# Patient Record
Sex: Male | Born: 1965 | Race: White | Hispanic: No | Marital: Married | Smoking: Never smoker
Health system: Southern US, Community
[De-identification: ages and names within clinical notes are randomized; demographics above are authoritative.]

## PROBLEM LIST (undated history)

## (undated) DIAGNOSIS — I10 Essential (primary) hypertension: Secondary | ICD-10-CM

## (undated) HISTORY — PX: GALLBLADDER SURGERY: SHX652

## (undated) HISTORY — PX: TUMOR REMOVAL: SHX12

---

## 1998-04-13 ENCOUNTER — Ambulatory Visit (HOSPITAL_COMMUNITY): Admission: RE | Admit: 1998-04-13 | Discharge: 1998-04-14 | Payer: Self-pay | Admitting: *Deleted

## 1998-04-13 ENCOUNTER — Encounter: Payer: Self-pay | Admitting: *Deleted

## 2011-11-14 ENCOUNTER — Other Ambulatory Visit: Payer: Self-pay | Admitting: Gastroenterology

## 2011-11-19 ENCOUNTER — Ambulatory Visit
Admission: RE | Admit: 2011-11-19 | Discharge: 2011-11-19 | Disposition: A | Discharge status: Home / Self Care | Payer: 59 | Source: Ambulatory Visit | Attending: Gastroenterology | Admitting: Gastroenterology

## 2011-11-20 ENCOUNTER — Other Ambulatory Visit: Payer: Self-pay | Admitting: Gastroenterology

## 2011-11-20 DIAGNOSIS — K769 Liver disease, unspecified: Secondary | ICD-10-CM

## 2011-11-25 ENCOUNTER — Other Ambulatory Visit: Payer: Self-pay | Admitting: Gastroenterology

## 2011-11-25 ENCOUNTER — Ambulatory Visit
Admission: RE | Admit: 2011-11-25 | Discharge: 2011-11-25 | Disposition: A | Discharge status: Home / Self Care | Payer: 59 | Source: Ambulatory Visit | Attending: Gastroenterology | Admitting: Gastroenterology

## 2011-11-25 DIAGNOSIS — K769 Liver disease, unspecified: Secondary | ICD-10-CM

## 2011-12-02 ENCOUNTER — Other Ambulatory Visit (HOSPITAL_COMMUNITY): Payer: Self-pay | Admitting: Gastroenterology

## 2011-12-02 DIAGNOSIS — K7689 Other specified diseases of liver: Secondary | ICD-10-CM

## 2011-12-11 ENCOUNTER — Encounter (HOSPITAL_COMMUNITY)
Admission: RE | Admit: 2011-12-11 | Discharge: 2011-12-11 | Disposition: A | Discharge status: Home / Self Care | Payer: 59 | Source: Ambulatory Visit | Attending: Gastroenterology | Admitting: Gastroenterology

## 2011-12-11 DIAGNOSIS — K7689 Other specified diseases of liver: Secondary | ICD-10-CM | POA: Insufficient documentation

## 2011-12-11 LAB — GLUCOSE, CAPILLARY: Glucose-Capillary: 127 mg/dL — ABNORMAL HIGH (ref 70–99)

## 2011-12-11 MED ORDER — FLUDEOXYGLUCOSE F - 18 (FDG) INJECTION
17.9000 | Freq: Once | INTRAVENOUS | Status: AC | PRN
  Administered 2011-12-11: 17.9 via INTRAVENOUS

## 2013-07-12 IMAGING — PT NM PET TUM IMG INITIAL (PI) SKULL BASE T - THIGH
1 of 6 series · 1 of 25 positions shown · non-contrast
Comparison: MRI 11/25/2011

CLINICAL DATA: Initial treatment strategy for indeterminate lesion
in the liver on MRI..

NUCLEAR MEDICINE PET SKULL BASE TO THIGH
Fasting Blood Glucose:  127
TECHNIQUE: 17.9 mCi F-18 FDG was injected intravenously. CT data
was obtained and used for attenuation correction and anatomic
localization only.  (This was not acquired as a diagnostic CT
examination.) Additional exam technical data entered on
technologist worksheet.

[Series 2: ct images · axial · 3.8mm · 0.98mm/px · 1 of 249 slices shown]
[im 249/249  brain]
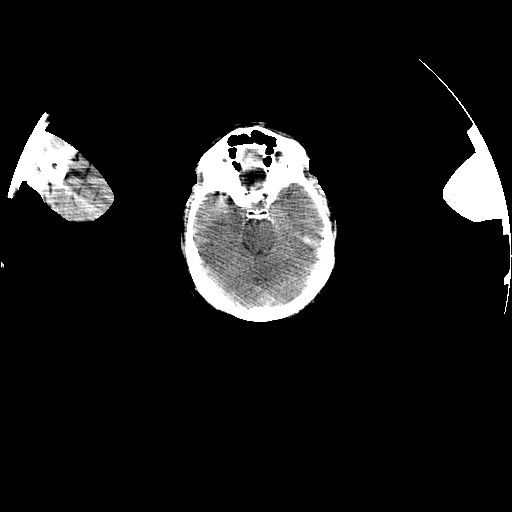

[1 of 25 positions shown; findings below may reference images not displayed]

FINDINGS: Neck: No hypermetabolic lymph nodes in the neck.

Chest:  No hypermetabolic mediastinal or hilar nodes.  No
suspicious pulmonary nodules on the CT scan.

Abdomen/Pelvis:  The two lesions within the liver  noted on the
comparison MRI are evident on the noncontrast CT portion of the
exam.   The lesions are relatively hyperdense on the background of
fatty infiltration measuring 20 mm in the medial right hepatic lobe
(image 135) and 14 mm along the falciform ligament.  These lesions
demonstrate no associate hypermetabolic activity.

[place]:  No focal hypermetabolic activity to suggest skeletal
metastasis.
IMPRESSION: No hypermetabolic activity associated with  hepatic lesions
described on comparison MRI.  Favor these to represent benign
lesions with differential diagnosis including atypical hemangioma,
focal nodular hyperplasia, or atypical focal fatty sparing. Due to
the indeterminate nature of the lesion, recommend follow-up
ultrasound in  6 months.

## 2013-07-19 ENCOUNTER — Other Ambulatory Visit: Payer: Self-pay | Admitting: Physician Assistant

## 2013-07-19 ENCOUNTER — Ambulatory Visit
Admission: RE | Admit: 2013-07-19 | Discharge: 2013-07-19 | Disposition: A | Discharge status: Home / Self Care | Payer: 59 | Source: Ambulatory Visit | Attending: Physician Assistant | Admitting: Physician Assistant

## 2013-07-19 DIAGNOSIS — R059 Cough, unspecified: Secondary | ICD-10-CM

## 2013-07-19 DIAGNOSIS — R05 Cough: Secondary | ICD-10-CM

## 2013-09-29 ENCOUNTER — Ambulatory Visit (INDEPENDENT_AMBULATORY_CARE_PROVIDER_SITE_OTHER): Payer: 59 | Admitting: Licensed Clinical Social Worker

## 2013-09-29 DIAGNOSIS — F411 Generalized anxiety disorder: Secondary | ICD-10-CM

## 2013-10-06 ENCOUNTER — Ambulatory Visit (INDEPENDENT_AMBULATORY_CARE_PROVIDER_SITE_OTHER): Payer: 59 | Admitting: Licensed Clinical Social Worker

## 2013-10-06 DIAGNOSIS — F411 Generalized anxiety disorder: Secondary | ICD-10-CM

## 2013-10-19 ENCOUNTER — Ambulatory Visit: Payer: 59 | Admitting: Licensed Clinical Social Worker

## 2013-10-26 ENCOUNTER — Ambulatory Visit (INDEPENDENT_AMBULATORY_CARE_PROVIDER_SITE_OTHER): Payer: 59 | Admitting: Licensed Clinical Social Worker

## 2013-10-26 DIAGNOSIS — F411 Generalized anxiety disorder: Secondary | ICD-10-CM

## 2013-11-16 ENCOUNTER — Ambulatory Visit (INDEPENDENT_AMBULATORY_CARE_PROVIDER_SITE_OTHER): Payer: 59 | Admitting: Licensed Clinical Social Worker

## 2013-11-16 DIAGNOSIS — F411 Generalized anxiety disorder: Secondary | ICD-10-CM

## 2013-11-30 ENCOUNTER — Ambulatory Visit: Payer: 59 | Admitting: Licensed Clinical Social Worker

## 2014-07-02 ENCOUNTER — Emergency Department (HOSPITAL_COMMUNITY)
Admission: EM | Admit: 2014-07-02 | Discharge: 2014-07-02 | Disposition: A | Discharge status: Home / Self Care | Payer: 59 | Attending: Emergency Medicine | Admitting: Emergency Medicine

## 2014-07-02 ENCOUNTER — Emergency Department (HOSPITAL_COMMUNITY): Payer: 59

## 2014-07-02 ENCOUNTER — Encounter (HOSPITAL_COMMUNITY): Payer: Self-pay | Admitting: *Deleted

## 2014-07-02 DIAGNOSIS — I10 Essential (primary) hypertension: Secondary | ICD-10-CM | POA: Insufficient documentation

## 2014-07-02 DIAGNOSIS — E669 Obesity, unspecified: Secondary | ICD-10-CM | POA: Insufficient documentation

## 2014-07-02 DIAGNOSIS — I160 Hypertensive urgency: Secondary | ICD-10-CM

## 2014-07-02 DIAGNOSIS — R140 Abdominal distension (gaseous): Secondary | ICD-10-CM | POA: Diagnosis present

## 2014-07-02 DIAGNOSIS — K7689 Other specified diseases of liver: Secondary | ICD-10-CM | POA: Insufficient documentation

## 2014-07-02 DIAGNOSIS — K769 Liver disease, unspecified: Secondary | ICD-10-CM

## 2014-07-02 HISTORY — DX: Essential (primary) hypertension: I10

## 2014-07-02 LAB — COMPREHENSIVE METABOLIC PANEL
ALK PHOS: 147 U/L — AB (ref 39–117)
ALT: 337 U/L — AB (ref 0–53)
AST: 350 U/L — ABNORMAL HIGH (ref 0–37)
Albumin: 4.1 g/dL (ref 3.5–5.2)
Anion gap: 18 — ABNORMAL HIGH (ref 5–15)
BUN: 7 mg/dL (ref 6–23)
CALCIUM: 10.4 mg/dL (ref 8.4–10.5)
CO2: 23 meq/L (ref 19–32)
Chloride: 94 mEq/L — ABNORMAL LOW (ref 96–112)
Creatinine, Ser: 0.64 mg/dL (ref 0.50–1.35)
GFR calc non Af Amer: >90 mL/min (ref >90)
GLUCOSE: 144 mg/dL — AB (ref 70–99)
Potassium: 3.8 mEq/L (ref 3.7–5.3)
SODIUM: 135 meq/L — AB (ref 137–147)
Total Bilirubin: 2.7 mg/dL — ABNORMAL HIGH (ref 0.3–1.2)
Total Protein: 8.2 g/dL (ref 6.0–8.3)

## 2014-07-02 LAB — CBC WITH DIFFERENTIAL/PLATELET
Basophils Absolute: 0.1 10*3/uL (ref 0.0–0.1)
Basophils Relative: 1 % (ref 0–1)
EOS PCT: 3 % (ref 0–5)
Eosinophils Absolute: 0.3 10*3/uL (ref 0.0–0.7)
HCT: 43.7 % (ref 39.0–52.0)
HEMOGLOBIN: 15.1 g/dL (ref 13.0–17.0)
LYMPHS ABS: 0.9 10*3/uL (ref 0.7–4.0)
LYMPHS PCT: 9 % — AB (ref 12–46)
MCH: 32.4 pg (ref 26.0–34.0)
MCHC: 34.6 g/dL (ref 30.0–36.0)
MCV: 93.8 fL (ref 78.0–100.0)
MONOS PCT: 11 % (ref 3–12)
Monocytes Absolute: 1.1 10*3/uL — ABNORMAL HIGH (ref 0.1–1.0)
Neutro Abs: 7.7 10*3/uL (ref 1.7–7.7)
Neutrophils Relative %: 76 % (ref 43–77)
PLATELETS: 154 10*3/uL (ref 150–400)
RBC: 4.66 MIL/uL (ref 4.22–5.81)
RDW: 14.3 % (ref 11.5–15.5)
WBC: 10.1 10*3/uL (ref 4.0–10.5)

## 2014-07-02 LAB — URINALYSIS, ROUTINE W REFLEX MICROSCOPIC
GLUCOSE, UA: NEGATIVE mg/dL
HGB URINE DIPSTICK: NEGATIVE
Ketones, ur: 40 mg/dL — AB
Leukocytes, UA: NEGATIVE
Nitrite: NEGATIVE
PROTEIN: 30 mg/dL — AB
Specific Gravity, Urine: 1.015 (ref 1.005–1.030)
Urobilinogen, UA: 1 mg/dL (ref 0.0–1.0)
pH: 6.5 (ref 5.0–8.0)

## 2014-07-02 LAB — URINE MICROSCOPIC-ADD ON

## 2014-07-02 LAB — LIPASE, BLOOD: Lipase: 47 U/L (ref 11–59)

## 2014-07-02 MED ORDER — SODIUM CHLORIDE 0.9 % IV BOLUS (SEPSIS)
1000.0000 mL | Freq: Once | INTRAVENOUS | Status: AC
  Administered 2014-07-02: 1000 mL via INTRAVENOUS

## 2014-07-02 MED ORDER — HYDRALAZINE HCL 20 MG/ML IJ SOLN
10.0000 mg | INTRAMUSCULAR | Status: AC
  Administered 2014-07-02: 10 mg via INTRAVENOUS
  Filled 2014-07-02: qty 1

## 2014-07-02 MED ORDER — LORAZEPAM 2 MG/ML IJ SOLN
1.0000 mg | Freq: Once | INTRAMUSCULAR | Status: AC
  Administered 2014-07-02: 1 mg via INTRAVENOUS
  Filled 2014-07-02: qty 1

## 2014-07-02 MED ORDER — METOPROLOL SUCCINATE ER 25 MG PO TB24: 25.0000 mg | ORAL_TABLET | Freq: Every day | ORAL | Status: DC

## 2014-07-02 MED ORDER — HYDRALAZINE HCL 10 MG PO TABS: 10.0000 mg | ORAL_TABLET | Freq: Three times a day (TID) | ORAL | Status: AC

## 2014-07-02 MED ORDER — IOHEXOL 300 MG/ML  SOLN
50.0000 mL | Freq: Once | INTRAMUSCULAR | Status: AC | PRN
  Administered 2014-07-02: 50 mL via ORAL

## 2014-07-02 NOTE — ED Notes (Signed)
Pt ambulated to restroom with steady gait.

## 2014-07-02 NOTE — ED Notes (Signed)
Pt noted a small "knot" at top of incisiton  Line yesterday, noted to feel bloated and with pain of 1-2. More pain with sitting. No N/V Incision is several years old.

## 2014-07-02 NOTE — ED Provider Notes (Signed)
CSN: [id number]     Arrival date & time 07/02/14  0109 History   First MD Initiated Contact with Patient 07/02/14 1118     Chief Complaint  Patient presents with  . Bloated  . Hypertension     (Consider location/radiation/quality/duration/timing/severity/associated sxs/prior Treatment) HPI  Patient presents with concern of anorexia, nausea, abdominal pain and mass. Patient is approximately 3 days ago, he noticed a palpable lesion in his upper abdomen.  Prior to this he had a gradually become aware of increasing generalized bloating in the abdomen as well. During this illness the patient has had a gradually developing anorexia, with decreased by mouth intake. Patient typically drinks substantial amounts, has decreased alcohol intake notably over the past week. Currently he denies lightheadedness, syncope, confusion, his rotation, chest pain, dyspnea. Patient has history of abdominal surgery in the distant past. Patient has a history of anxiety, hypertension.   Past Medical History  Diagnosis Date  . Hypertension    Past Surgical History  Procedure Laterality Date  . Tumor removal     No family history on file. History  Substance Use Topics  . Smoking status: Never Smoker   . Smokeless tobacco: Not on file  . Alcohol Use: Yes    Review of Systems  Constitutional:       Per HPI, otherwise negative  HENT:       Per HPI, otherwise negative  Respiratory:       Per HPI, otherwise negative  Cardiovascular:       Per HPI, otherwise negative  Gastrointestinal: Negative for vomiting.  Endocrine:       Negative aside from HPI  Genitourinary:       Neg aside from HPI   Musculoskeletal:       Per HPI, otherwise negative  Skin: Negative.   Neurological: Negative for syncope.      Allergies  Review of patient's allergies indicates no known allergies.  Home Medications   Prior to Admission medications   Not on File   BP 184/98 mmHg  Pulse 115  Temp(Src) 99 F  (37.2 C) (Oral)  Wt 243 lb (110.224 kg)  SpO2 100% Physical Exam  Constitutional: He is oriented to person, place, and time. He appears well-developed. No distress.  Obese male resting on the gurney, twitching  HENT:  Head: Normocephalic and atraumatic.  Eyes: Conjunctivae and EOM are normal.  Cardiovascular: Normal rate and regular rhythm.   Pulmonary/Chest: Effort normal. No stridor. No respiratory distress.  Abdominal: He exhibits no distension.    Musculoskeletal: He exhibits no edema.  Neurological: He is alert and oriented to person, place, and time.  Twitching otherwise unremarkable  Skin: Skin is warm and dry.  Psychiatric: He has a normal mood and affect.  Nursing note and vitals reviewed.   ED Course  Procedures (including critical care time) Labs Review Labs Reviewed  CBC WITH DIFFERENTIAL - Abnormal; Notable for the following:    Lymphocytes Relative 9 (*)    Monocytes Absolute 1.1 (*)    All other components within normal limits  COMPREHENSIVE METABOLIC PANEL - Abnormal; Notable for the following:    Sodium 135 (*)    Chloride 94 (*)    Glucose, Bld 144 (*)    AST 350 (*)    ALT 337 (*)    Alkaline Phosphatase 147 (*)    Total Bilirubin 2.7 (*)    Anion gap 18 (*)    All other components within normal limits  URINALYSIS, ROUTINE W  REFLEX MICROSCOPIC - Abnormal; Notable for the following:    Color, Urine AMBER (*)    Bilirubin Urine SMALL (*)    Ketones, ur 40 (*)    Protein, ur 30 (*)    All other components within normal limits  LIPASE, BLOOD  URINE MICROSCOPIC-ADD ON    Imaging Review Ct Abdomen Pelvis Wo Contrast  07/02/2014   CLINICAL DATA:  Upper abdominal pressure x3 days. Prior cholecystectomy.  EXAM: CT ABDOMEN AND PELVIS WITHOUT CONTRAST  TECHNIQUE: Multidetector CT imaging of the abdomen and pelvis was performed following the standard protocol without IV contrast.  COMPARISON:  PET-CT dated 12/11/2011.  MRI abdomen dated 11/25/2011.   FINDINGS: Lower chest:  Lung bases are clear.  Hepatobiliary: Severe hepatic steatosis.  10 mm lesion along the falciform ligament (series 2/image 20) and 9 mm lesion in the anterior segment right hepatic lobe (series 2/image 30), less conspicuous than on prior MRI, possibly reflecting focal fatty sparing.  Status post cholecystectomy. No intrahepatic or extrahepatic ductal dilatation.  Pancreas: Within normal limits.  Spleen: Within normal limits.  Adrenals/Urinary Tract: Adrenal gland unremarkable.  Kidneys are within normal limits. No renal calculi or hydronephrosis.  No ureteral or bladder calculi.  Bladder is mildly thick-walled but underdistended.  Stomach/Bowel: Stomach is unremarkable.  No evidence of bowel obstruction.  Normal appendix.  Vascular/Lymphatic: No evidence of abdominal aortic aneurysm.  No suspicious abdominopelvic lymphadenopathy.  Reproductive: Prostate is unremarkable.  Other: No abdominopelvic ascites.  Small fat containing left inguinal hernia.  Musculoskeletal: Mild degenerative changes of the visualized thoracolumbar spine.  IMPRESSION: Severe hepatic steatosis.  Prior cholecystectomy.  No CT findings to account for the patient's abdominal pain.   Electronically Signed   By: Julian Hy M.D.   On: 07/02/2014 13:46   3:00 PM Patient awake and alert, sitting upright, smiling. BP has improved, Patient is in no distress, and he and his wife both voice understanding all findings, including liver function abnormalities. Patient voices a preference for outpatient follow-up. Patient has access to a gastroenterologist, and this seems reasonable.  MDM  Patient presents with abdominal pain, palpable mass, nausea, anorexia. Patient has a history of substantial alcohol intake, though none recently. 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 Patient's blood pressure decreased with single dose of beta blocker, and given the patient's  previous he diagnosed hypertension, he will be started on this medication. Patient will follow up with his primary care physician and gastroenterology for further evaluation and management per his request.    Carmin Muskrat, MD 07/02/14 [phone]

## 2014-07-02 NOTE — Discharge Instructions (Signed)
As discussed, it is important that you follow up as soon as possible with your physician for continued management of your condition.  If you develop any new, or concerning changes in your condition, please return to the emergency department immediately.  Please continue to refrain from taking alcohol.

## 2014-07-27 ENCOUNTER — Telehealth: Payer: Self-pay | Admitting: *Deleted

## 2014-07-27 ENCOUNTER — Encounter: Payer: Self-pay | Admitting: Internal Medicine

## 2014-07-27 ENCOUNTER — Ambulatory Visit (INDEPENDENT_AMBULATORY_CARE_PROVIDER_SITE_OTHER): Payer: 59 | Admitting: Internal Medicine

## 2014-07-27 ENCOUNTER — Ambulatory Visit (INDEPENDENT_AMBULATORY_CARE_PROVIDER_SITE_OTHER)
Admission: RE | Admit: 2014-07-27 | Discharge: 2014-07-27 | Disposition: A | Discharge status: Home / Self Care | Payer: 59 | Source: Ambulatory Visit | Attending: Internal Medicine | Admitting: Internal Medicine

## 2014-07-27 VITALS — BP 146/80 | HR 72 | Ht 68.0 in | Wt 226.0 lb

## 2014-07-27 DIAGNOSIS — R05 Cough: Secondary | ICD-10-CM

## 2014-07-27 DIAGNOSIS — R058 Other specified cough: Secondary | ICD-10-CM | POA: Insufficient documentation

## 2014-07-27 MED ORDER — PANTOPRAZOLE SODIUM 40 MG PO TBEC: 40.0000 mg | DELAYED_RELEASE_TABLET | Freq: Every day | ORAL | Status: DC

## 2014-07-27 MED ORDER — TRAMADOL HCL 50 MG PO TABS: ORAL_TABLET | ORAL | Status: DC

## 2014-07-27 MED ORDER — PREDNISONE 10 MG PO TABS: ORAL_TABLET | ORAL | Status: DC

## 2014-07-27 MED ORDER — FAMOTIDINE 20 MG PO TABS: ORAL_TABLET | ORAL | Status: DC

## 2014-07-27 NOTE — Telephone Encounter (Signed)
-----   Message from Tanda Rockers, MD sent at 07/27/2014  4:26 PM EST ----- Call pt:  Reviewed cxr and no acute change so no change in recommendations made at [facility]

## 2014-07-27 NOTE — Telephone Encounter (Signed)
Patient informed no acute changes in cxr therefore no change in office recommendations. Patient verbalizes understanding.

## 2014-07-27 NOTE — Assessment & Plan Note (Signed)

## 2014-07-27 NOTE — Progress Notes (Signed)
Subjective:    Patient ID: Joe Simmons, male    DOB: 12/20/1965,    MRN: [id number]  HPI  79 yowm never smoker   with onset cough Nov 2014 dx as bronchitis but never completely resolved p rx with abx  so self referred to pulmonary clinic 07/27/2014    07/27/2014 1st [facility] office visit/ Raguel Kosloski   Chief Complaint  Patient presents with  . Pulmonary Consult    Self referral. Pt c/o cough x 4 wks- non prod and made worse by cold air. He states that he gets SOB "with any exercise, or when I move around too much".   seen in ER for abd pain ? 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 but says no longer drinking since in ER   Cough  Daily x one year p abrupt onset duuring uri:  Typically starts  immediately p wakes up each am worse with cold air/ coughs so hard vomit otherwise non productive - tends to settle down overnight  Worse with symbicort Mostly sob with coughing but also if over does it  No obvious other patterns in day to day or daytime variabilty or assoc   cp or chest tightness, subjective wheeze overt sinus or hb symptoms. No unusual exp hx or h/o childhood pna/ asthma or knowledge of premature birth.  Sleeping ok without nocturnal  or early am exacerbation  of respiratory  c/o's or need for noct saba. Also denies any obvious fluctuation of symptoms with weather or environmental changes or other aggravating or alleviating factors except as outlined above   Current Medications, Allergies, Complete Past Medical History, Past Surgical History, Family History, and Social History were reviewed in [employer] record.             Review of Systems  Constitutional: Positive for appetite change. Negative for fever, chills, activity change and unexpected weight change.  HENT: Negative for congestion, dental problem, postnasal drip, rhinorrhea, sneezing, sore throat, trouble swallowing and voice change.   Eyes: Negative for visual disturbance.  Respiratory: Positive for cough  and shortness of breath. Negative for choking.   Cardiovascular: Negative for chest pain and leg swelling.  Gastrointestinal: Negative for nausea, vomiting and abdominal pain.  Genitourinary: Negative for difficulty urinating.  Musculoskeletal: Negative for arthralgias.  Skin: Negative for rash.  Psychiatric/Behavioral: Negative for behavioral problems and confusion.       Objective:   Physical Exam  amb wm nad  . Wt Readings from Last 3 Encounters:  07/02/14 243 lb (110.224 kg)    Vital signs reviewed    HEENT: nl dentition, turbinates, and orophanx. Nl external ear canals without cough reflex   NECK :  without JVD/Nodes/TM/ nl carotid upstrokes bilaterally   LUNGS: no acc muscle use, clear to A and P bilaterally with cough variable on insp    CV:  RRR  no s3 or murmur or increase in P2, no edema   ABD:  soft and nontender with nl excursion in the supine position. No bruits or organomegaly, bowel sounds nl  MS:  warm without deformities, calf tenderness, cyanosis or clubbing  SKIN: warm and dry without lesions    NEURO:  alert, approp, no deficits    CXR PA and Lateral:   07/27/2014 :     I personally reviewed images and agree with radiology impression as follows:     1. Low lung volumes with mild basilar atelectasis.  2. Mild cardiomegaly, no CHF.     Assessment &  Plan:

## 2014-07-27 NOTE — Patient Instructions (Signed)
The key to effective treatment for your cough is eliminating the non-stop cycle of cough you're stuck in long enough to let your airway heal completely and then see if there is anything still making you cough once you stop the cough suppression, but this should take no more than 5 days to figure out  First take delsym two tsp every 12 hours and supplement if needed with  tramadol 50 mg up to 2 every 4 hours to suppress the urge to cough at all or even clear your throat. Swallowing water or using ice chips/non mint and menthol containing candies (such as lifesavers or sugarless jolly ranchers) are also effective.  You should rest your voice and avoid activities that you know make you cough.  Once you have eliminated the cough for 3 straight days try reducing the tramadol first,  then the delsym as tolerated.    Prednisone 10 mg take  4 each am x 2 days,   2 each am x 2 days,  1 each am x 2 days and stop (this is to eliminate allergies and inflammation from coughing)  Protonix (pantoprazole) Take 30-60 min before first meal of the day and Pepcid 20 mg one bedtime plus chlorpheniramine 4 mg x 2 at bedtime (both available over the counter)  until cough is completely gone for at least a week without the need for cough suppression  For drainage take chlortrimeton (chlorpheniramine) 4 mg every 4 hours available over the counter (may cause drowsiness)   GERD (REFLUX)  is an extremely common cause of respiratory symptoms, many times with no significant heartburn at all.    It can be treated with medication, but also with lifestyle changes including avoidance of late meals, excessive alcohol, smoking cessation, and avoid fatty foods, chocolate, peppermint, colas, red wine, and acidic juices such as orange juice.  NO MINT OR MENTHOL PRODUCTS SO NO COUGH DROPS  USE HARD CANDY INSTEAD (jolley ranchers or Stover's or Lifesavers (all available in sugarless versions) NO OIL BASED VITAMINS - use powdered  substitutes.

## 2014-07-31 ENCOUNTER — Telehealth: Payer: Self-pay | Admitting: Internal Medicine

## 2014-07-31 NOTE — Telephone Encounter (Signed)
Prednisone can cause an acneiform rash but not typically itchy. Can use tramadol for pain, needs to return to see Tammy NP in  A few days if not better but ok to stop prednisone for now

## 2014-07-31 NOTE — Telephone Encounter (Signed)
Wife is aware of recs from [facility] and will call for OV with TP if not better in few days.

## 2014-07-31 NOTE — Telephone Encounter (Signed)
Spoke with patients wife-states patient has developed headache daily for past 4 days(hurts with movement) and now has pimple like outbreak on his face and legs. Would like to know if any of the meds given by MW could cause this and what to do.  Today is the last dose of Prednisone given.  Prednisone Tramadol Pepcid Protonix Delsym OTC cough syrup.  MW please advise. Thanks.

## 2014-08-02 ENCOUNTER — Other Ambulatory Visit: Payer: Self-pay | Admitting: Internal Medicine

## 2014-08-03 ENCOUNTER — Other Ambulatory Visit: Payer: Self-pay | Admitting: Internal Medicine

## 2014-08-03 ENCOUNTER — Telehealth: Payer: Self-pay | Admitting: Internal Medicine

## 2014-08-03 NOTE — Telephone Encounter (Signed)
According to epic this was already called in today. Called made [facility] aware. Nothing further needed

## 2014-08-10 ENCOUNTER — Ambulatory Visit (INDEPENDENT_AMBULATORY_CARE_PROVIDER_SITE_OTHER): Payer: 59 | Admitting: Internal Medicine

## 2014-08-10 ENCOUNTER — Encounter: Payer: Self-pay | Admitting: Internal Medicine

## 2014-08-10 VITALS — BP 140/84 | HR 108 | Ht 69.0 in | Wt 220.0 lb

## 2014-08-10 DIAGNOSIS — L270 Generalized skin eruption due to drugs and medicaments taken internally: Secondary | ICD-10-CM

## 2014-08-10 DIAGNOSIS — R058 Other specified cough: Secondary | ICD-10-CM

## 2014-08-10 DIAGNOSIS — R05 Cough: Secondary | ICD-10-CM

## 2014-08-10 MED ORDER — METHYLPREDNISOLONE 4 MG PO TABS: ORAL_TABLET | ORAL | Status: AC

## 2014-08-10 NOTE — Progress Notes (Signed)
Subjective:    Patient ID: Joe Simmons, male    DOB: 1966-04-26,    MRN: [id number]    Brief patient profile:  26 yowm never smoker   with onset cough Nov 2014 dx as bronchitis but never completely resolved p rx with abx  so self referred to pulmonary clinic 07/27/2014    History of Present Illness  07/27/2014 1st [facility] office visit/ [name]   Chief Complaint  Patient presents with  . Pulmonary Consult    Self referral. Pt c/o cough x 4 wks- non prod and made worse by cold air. He states that he gets SOB "with any exercise, or when I move around too much".   seen in ER for abd pain ? 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 but says no longer drinking since in ER  Cough  Daily x one year p abrupt onset duuring uri:  Typically starts  immediately p wakes up each am worse with cold air/ coughs so hard vomit otherwise non productive - tends to settle down overnight  Worse with symbicort Mostly sob with coughing but also if over does it rec First take delsym two tsp every 12 hours and supplement if needed with  tramadol 50 mg up to 2 every 4 hours Prednisone 10 mg take  4 each am x 2 days,   2 each am x 2 days,  1 each am x 2 days and stop (this is to eliminate allergies and inflammation from coughing) Protonix (pantoprazole) Take 30-60 min before first meal of the day and Pepcid 20 mg one bedtime plus chlorpheniramine 4 mg x 2 at bedtime (both available over the counter)  until cough is completely gone for at least a week without the need for cough suppression For drainage take chlortrimeton (chlorpheniramine) 4 mg every 4 hours   08/10/2014 f/u ov/[name] re: rash/ f/u cough  Chief Complaint  Patient presents with  . Follow-up    Pt states that his SOB and cough have resolved. He c/o rash since 07/29/14- painful and itchy on his back.   did not get 1st gen h1 but cough resolved within 5 days then rash started w/in 3 days  Itchy truncal pattern.  Not limited by breathing from desired activities    Stopped taking ppi and pepcid since onset of rash   No obvious day to day or daytime variabilty or assoc   cp or chest tightness, subjective wheeze overt sinus or hb symptoms. No unusual exp hx or h/o childhood pna/ asthma or knowledge of premature birth.  Sleeping ok without nocturnal  or early am exacerbation  of respiratory  c/o's or need for noct saba. Also denies any obvious fluctuation of symptoms with weather or environmental changes or other aggravating or alleviating factors except as outlined above   Current Medications, Allergies, Complete Past Medical History, Past Surgical History, Family History, and Social History were reviewed in [employer] record.  ROS  The following are not active complaints unless bolded sore throat, dysphagia, dental problems, itching, sneezing,  nasal congestion or excess/ purulent secretions, ear ache,   fever, chills, sweats, unintended wt loss, pleuritic or exertional cp, hemoptysis,  orthopnea pnd or leg swelling, presyncope, palpitations, heartburn, abdominal pain, anorexia, nausea, vomiting, diarrhea  or change in bowel or urinary habits, change in stools or urine, dysuria,hematuria,  rash, arthralgias, visual complaints, headache, numbness weakness or ataxia or problems with walking or coordination,  change in mood/affect or memory.  Objective:   Physical Exam  amb wm nad  Wt Readings from Last 3 Encounters:  08/10/14 220 lb (99.791 kg)  07/27/14 226 lb (102.513 kg)  07/02/14 243 lb (110.224 kg)    Vital signs reviewed    HEENT: nl dentition, turbinates, and orophanx. Nl external ear canals without cough reflex   NECK :  without JVD/Nodes/TM/ nl carotid upstrokes bilaterally   LUNGS: no acc muscle use, clear to A and P bilaterally with no longer cough on insp    CV:  RRR  no s3 or murmur or increase in P2, no edema   ABD:  soft and nontender with nl excursion in the supine position. No  bruits or organomegaly, bowel sounds nl  MS:  warm without deformities, calf tenderness, cyanosis or clubbing  SKIN: acneform rash over torso only  NEURO:  alert, approp, no deficits    CXR PA and Lateral:   07/27/2014 :     I personally reviewed images and agree with radiology impression as follows:     1. Low lung volumes with mild basilar atelectasis.  2. Mild cardiomegaly, no CHF.     Assessment & Plan:

## 2014-08-10 NOTE — Patient Instructions (Signed)
Medrol 4 mg x 4 each am x 2 days,   2 each am x 2 days,  1 each am x 2 days and stop   For drainage or itching   take chlortrimeton (chlorpheniramine) 4 mg every 4 hours available over the counter (may cause drowsiness)   If not better by first of next week call for dermatology referral.

## 2014-08-11 ENCOUNTER — Encounter: Payer: Self-pay | Admitting: Internal Medicine

## 2014-08-11 DIAGNOSIS — L270 Generalized skin eruption due to drugs and medicaments taken internally: Secondary | ICD-10-CM | POA: Insufficient documentation

## 2014-08-11 NOTE — Assessment & Plan Note (Signed)
Improved with elimination of cyclical cough / ? Will it return off gerd rx > since ppi may well have been the cause of the rash rec just rx with 1st gen h1 then if cough comes back try bid h2 rather than rechallenge with ppi  See instructions for specific recommendations which were reviewed directly with the patient who was given a copy with highlighter outlining the key components.

## 2014-08-11 NOTE — Assessment & Plan Note (Signed)
Probably ppi related though not clear > for now rx medrol x 6 days and prn h1 (which he never tried for cough but may well prove effective if mech is pnds related

## 2016-08-26 DIAGNOSIS — H00024 Hordeolum internum left upper eyelid: Secondary | ICD-10-CM | POA: Diagnosis not present

## 2016-10-21 DIAGNOSIS — H40009 Preglaucoma, unspecified, unspecified eye: Secondary | ICD-10-CM | POA: Diagnosis not present

## 2017-01-15 DIAGNOSIS — E782 Mixed hyperlipidemia: Secondary | ICD-10-CM | POA: Diagnosis not present

## 2017-01-15 DIAGNOSIS — Z0000 Encounter for general adult medical examination without abnormal findings: Secondary | ICD-10-CM | POA: Diagnosis not present

## 2017-01-15 DIAGNOSIS — I10 Essential (primary) hypertension: Secondary | ICD-10-CM | POA: Diagnosis not present

## 2017-01-15 DIAGNOSIS — Z125 Encounter for screening for malignant neoplasm of prostate: Secondary | ICD-10-CM | POA: Diagnosis not present

## 2017-01-15 DIAGNOSIS — M2550 Pain in unspecified joint: Secondary | ICD-10-CM | POA: Diagnosis not present

## 2017-04-01 DIAGNOSIS — D1801 Hemangioma of skin and subcutaneous tissue: Secondary | ICD-10-CM | POA: Diagnosis not present

## 2017-04-01 DIAGNOSIS — D2262 Melanocytic nevi of left upper limb, including shoulder: Secondary | ICD-10-CM | POA: Diagnosis not present

## 2017-04-01 DIAGNOSIS — Z85828 Personal history of other malignant neoplasm of skin: Secondary | ICD-10-CM | POA: Diagnosis not present

## 2017-07-30 DIAGNOSIS — M2550 Pain in unspecified joint: Secondary | ICD-10-CM | POA: Diagnosis not present

## 2017-07-30 DIAGNOSIS — I10 Essential (primary) hypertension: Secondary | ICD-10-CM | POA: Diagnosis not present

## 2018-05-04 DIAGNOSIS — D1801 Hemangioma of skin and subcutaneous tissue: Secondary | ICD-10-CM | POA: Diagnosis not present

## 2018-05-04 DIAGNOSIS — Z85828 Personal history of other malignant neoplasm of skin: Secondary | ICD-10-CM | POA: Diagnosis not present

## 2018-05-04 DIAGNOSIS — L738 Other specified follicular disorders: Secondary | ICD-10-CM | POA: Diagnosis not present

## 2018-11-19 DIAGNOSIS — M25541 Pain in joints of right hand: Secondary | ICD-10-CM | POA: Diagnosis not present

## 2018-11-19 DIAGNOSIS — M25542 Pain in joints of left hand: Secondary | ICD-10-CM | POA: Diagnosis not present

## 2018-11-19 DIAGNOSIS — I10 Essential (primary) hypertension: Secondary | ICD-10-CM | POA: Diagnosis not present

## 2019-10-24 ENCOUNTER — Ambulatory Visit: Payer: Self-pay

## 2024-07-20 ENCOUNTER — Other Ambulatory Visit (HOSPITAL_BASED_OUTPATIENT_CLINIC_OR_DEPARTMENT_OTHER): Payer: Self-pay | Admitting: Family Medicine

## 2024-07-20 DIAGNOSIS — Z136 Encounter for screening for cardiovascular disorders: Secondary | ICD-10-CM

## 2024-08-05 ENCOUNTER — Ambulatory Visit (HOSPITAL_BASED_OUTPATIENT_CLINIC_OR_DEPARTMENT_OTHER)
Admission: RE | Admit: 2024-08-05 | Discharge: 2024-08-05 | Disposition: A | Payer: Self-pay | Source: Ambulatory Visit | Attending: Family Medicine | Admitting: Family Medicine

## 2024-08-05 DIAGNOSIS — Z136 Encounter for screening for cardiovascular disorders: Secondary | ICD-10-CM | POA: Insufficient documentation

## 2024-08-18 ENCOUNTER — Other Ambulatory Visit (HOSPITAL_BASED_OUTPATIENT_CLINIC_OR_DEPARTMENT_OTHER): Payer: Self-pay | Admitting: Family Medicine

## 2024-08-18 DIAGNOSIS — R911 Solitary pulmonary nodule: Secondary | ICD-10-CM

## 2024-08-22 ENCOUNTER — Ambulatory Visit (HOSPITAL_BASED_OUTPATIENT_CLINIC_OR_DEPARTMENT_OTHER)
